# Patient Record
Sex: Male | Born: 2000 | Race: White | Hispanic: No | Marital: Single | Smoking: Never smoker
Health system: Southern US, Community
[De-identification: ages and names within clinical notes are randomized; demographics above are authoritative.]

---

## 2019-08-19 ENCOUNTER — Emergency Department (HOSPITAL_COMMUNITY)
Admission: EM | Admit: 2019-08-19 | Discharge: 2019-08-19 | Disposition: A | Discharge status: Home / Self Care | Payer: BC Managed Care – PPO | Attending: Emergency Medicine | Admitting: Emergency Medicine

## 2019-08-19 ENCOUNTER — Other Ambulatory Visit: Payer: Self-pay

## 2019-08-19 ENCOUNTER — Encounter (HOSPITAL_COMMUNITY): Payer: Self-pay | Admitting: Emergency Medicine

## 2019-08-19 ENCOUNTER — Emergency Department (HOSPITAL_COMMUNITY): Payer: BC Managed Care – PPO

## 2019-08-19 DIAGNOSIS — Y92039 Unspecified place in apartment as the place of occurrence of the external cause: Secondary | ICD-10-CM | POA: Insufficient documentation

## 2019-08-19 DIAGNOSIS — Y939 Activity, unspecified: Secondary | ICD-10-CM | POA: Diagnosis not present

## 2019-08-19 DIAGNOSIS — Y999 Unspecified external cause status: Secondary | ICD-10-CM | POA: Diagnosis not present

## 2019-08-19 DIAGNOSIS — S60511A Abrasion of right hand, initial encounter: Secondary | ICD-10-CM | POA: Insufficient documentation

## 2019-08-19 DIAGNOSIS — W540XXA Bitten by dog, initial encounter: Secondary | ICD-10-CM | POA: Insufficient documentation

## 2019-08-19 DIAGNOSIS — S61451A Open bite of right hand, initial encounter: Secondary | ICD-10-CM

## 2019-08-19 MED ORDER — AMOXICILLIN-POT CLAVULANATE 875-125 MG PO TABS
1.0000 | ORAL_TABLET | Freq: Once | ORAL | Status: AC
  Administered 2019-08-19: 1 via ORAL
  Filled 2019-08-19: qty 1

## 2019-08-19 MED ORDER — BACITRACIN ZINC 500 UNIT/GM EX OINT
TOPICAL_OINTMENT | Freq: Two times a day (BID) | CUTANEOUS | Status: DC
  Administered 2019-08-19: 1 via TOPICAL
  Filled 2019-08-19: qty 0.9

## 2019-08-19 MED ORDER — AMOXICILLIN-POT CLAVULANATE 875-125 MG PO TABS: 1.0000 | ORAL_TABLET | Freq: Two times a day (BID) | ORAL | 0 refills | Status: AC

## 2019-08-19 NOTE — ED Provider Notes (Signed)
[facility] EMERGENCY DEPARTMENT Provider Note   CSN: [id number] Arrival date & time: 08/19/19  0112     History   Chief Complaint Chief Complaint  Patient presents with  . Dog Bite    Right Hand    HPI Vincent Benton is a 18 y.o. male.     Pt is fostering a pit mix.  He states he was getting ready to go to bed, so he was trying to wave the dog into it's crate.  When he waved his hand, the dog bit him.  Swelling & abrasions to R hand.  Pt states he has had a tetanus booster in the past 5 years, states he had to get one when he recently started college.   The history is provided by the patient.  Animal Bite Contact animal:  Dog Location:  Hand Hand injury location:  R hand Time since incident:  3 hours Pain details:    Quality:  Aching and sore   Severity:  Severe   Timing:  Constant   Progression:  Worsening Incident location:  Home Animal's rabies vaccination status:  Up to date Animal in possession: yes   Tetanus status:  Up to date Relieved by:  None tried Associated symptoms: swelling     History reviewed. No pertinent past medical history.  There are no active problems to display for this patient.   History reviewed. No pertinent surgical history.      Home Medications    Prior to Admission medications   Medication Sig Start Date End Date Taking? Authorizing Provider  amoxicillin-clavulanate (AUGMENTIN) 875-125 MG tablet Take 1 tablet by mouth every 12 (twelve) hours for 4 days. 08/19/19 08/23/19  Viviano Simas, NP    Family History No family history on file.  Social History Social History   Tobacco Use  . Smoking status: Never Smoker  . Smokeless tobacco: Never Used  Substance Use Topics  . Alcohol use: Never    Frequency: Never  . Drug use: Never     Allergies   Patient has no known allergies.   Review of Systems Review of Systems  All other systems reviewed and are negative.    Physical Exam  Updated Vital Signs There were no vitals taken for this visit.  Physical Exam Vitals signs and nursing note reviewed.  Constitutional:      General: He is not in acute distress.    Appearance: Normal appearance.  HENT:     Head: Normocephalic and atraumatic.     Nose: Nose normal.     Mouth/Throat:     Mouth: Mucous membranes are moist.     Pharynx: Oropharynx is clear.  Eyes:     Extraocular Movements: Extraocular movements intact.     Conjunctiva/sclera: Conjunctivae normal.  Neck:     Musculoskeletal: Normal range of motion.  Cardiovascular:     Rate and Rhythm: Normal rate.     Pulses: Normal pulses.  Pulmonary:     Effort: Pulmonary effort is normal.  Musculoskeletal: Normal range of motion.     Right hand: He exhibits tenderness and swelling. He exhibits normal range of motion and no deformity.     Comments: R hand w/ small abrasions (~1 cm)  to dorsomedial hand just proximal to 5th MCP joint, another 1 cm abrasion to R thenar eminence. 2-3 mm hematoma adjacent.  Full ROM of R hand & fingers, sensation intact.   Skin:    General: Skin is warm and dry.  Findings: No rash.  Neurological:     General: No focal deficit present.     Mental Status: He is alert.     Coordination: Coordination normal.      ED Treatments / Results  Labs (all labs ordered are listed, but only abnormal results are displayed) Labs Reviewed - No data to display  EKG None  Radiology Dg Hand Complete Right  Result Date: 08/19/2019 CLINICAL DATA:  Dog bite to right hand EXAM: RIGHT HAND - COMPLETE 3+ VIEW COMPARISON:  None. FINDINGS: There is no evidence of fracture or dislocation. There is no evidence of arthropathy or other focal bone abnormality. Soft tissues are unremarkable. No radiopaque foreign body. IMPRESSION: No acute bony abnormality or radiopaque foreign body. Electronically Signed   By: Rolm Baptise M.D.   On: 08/19/2019 01:49    Procedures Wound repair  Date/Time:  08/19/2019 3:54 AM Performed by: Charmayne Sheer, NP Authorized by: Charmayne Sheer, NP  Consent: Verbal consent obtained. Consent given by: patient Patient identity confirmed: arm band Time out: Immediately prior to procedure a "time out" was called to verify the correct patient, procedure, equipment, support staff and site/side marked as required. Local anesthesia used: no  Anesthesia: Local anesthesia used: no  Sedation: Patient sedated: no  Patient tolerance: patient tolerated the procedure well with no immediate complications Comments: Scrubbed abrasions on R hand w/ betadine, syringe irrigated w/ NS.  Applied bacitracin ointment.     (including critical care time)  Medications Ordered in ED Medications  amoxicillin-clavulanate (AUGMENTIN) 875-125 MG per tablet 1 tablet (has no administration in time range)  bacitracin ointment (has no administration in time range)     Initial Impression / Assessment and Plan / ED Course  I have reviewed the triage vital signs and the nursing notes.  Pertinent labs & imaging results that were available during my care of the patient were reviewed by me and considered in my medical decision making (see chart for details).        24 yom presents to the ED with dog bite to R hand.  Has abrasions & swelling as noted above.  Sensation intact, full ROM.  Cleaned wounds & applied antibiotic ointment as noted.  Dog & Pt vaccines UTD, dog is still in pt's possession.  Will start on augmentin for infection prophylaxis, first dose given prior to d/c. Discussed supportive care as well need for f/u w/ PCP in 1-2 days.  Also discussed sx that warrant sooner re-eval in ED. Patient / Family / Caregiver informed of clinical course, understand medical decision-making process, and agree with plan.   Final Clinical Impressions(s) / ED Diagnoses   Final diagnoses:  Dog bite of right hand, initial encounter    ED Discharge Orders         Ordered     amoxicillin-clavulanate (AUGMENTIN) 875-125 MG tablet  Every 12 hours     08/19/19 0357           Charmayne Sheer, NP 08/19/19 0357    Mesner, Corene Cornea, MD 08/19/19 [phone]

## 2019-08-19 NOTE — ED Triage Notes (Signed)
Patient reports dog bite to right hand this evening , presents with multiple superficial puncture wounds at right hand , no bleeding , mild swelling .

## 2019-08-19 NOTE — Discharge Instructions (Signed)
Monitor & return for signs of infection: Worsening pain, swelling, redness, pus drainage, or red streaking from bite sites. Apply antibiotic ointment 2-3 times/day.

## 2020-10-14 IMAGING — CR DG HAND COMPLETE 3+V*R*
3 series · 3 of 3 positions shown · non-contrast
Comparison: None.

CLINICAL DATA: Dog bite to right hand

EXAM:
RIGHT HAND - COMPLETE 3+ VIEW

[hand pa]
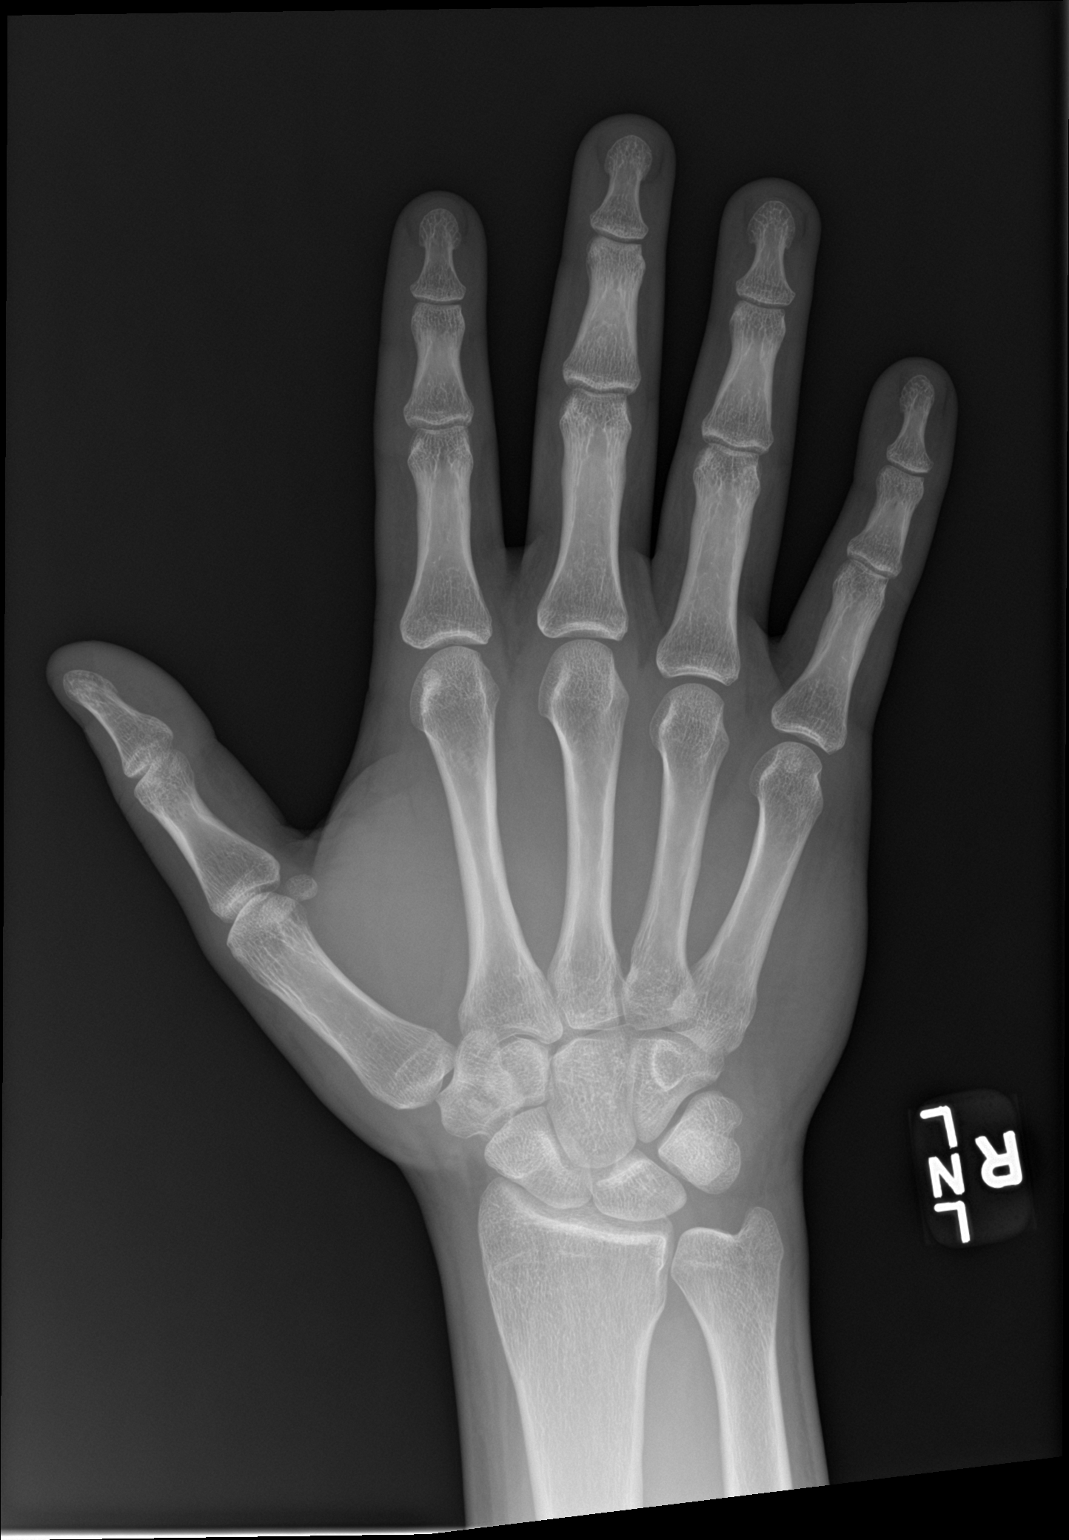

[hand obl]
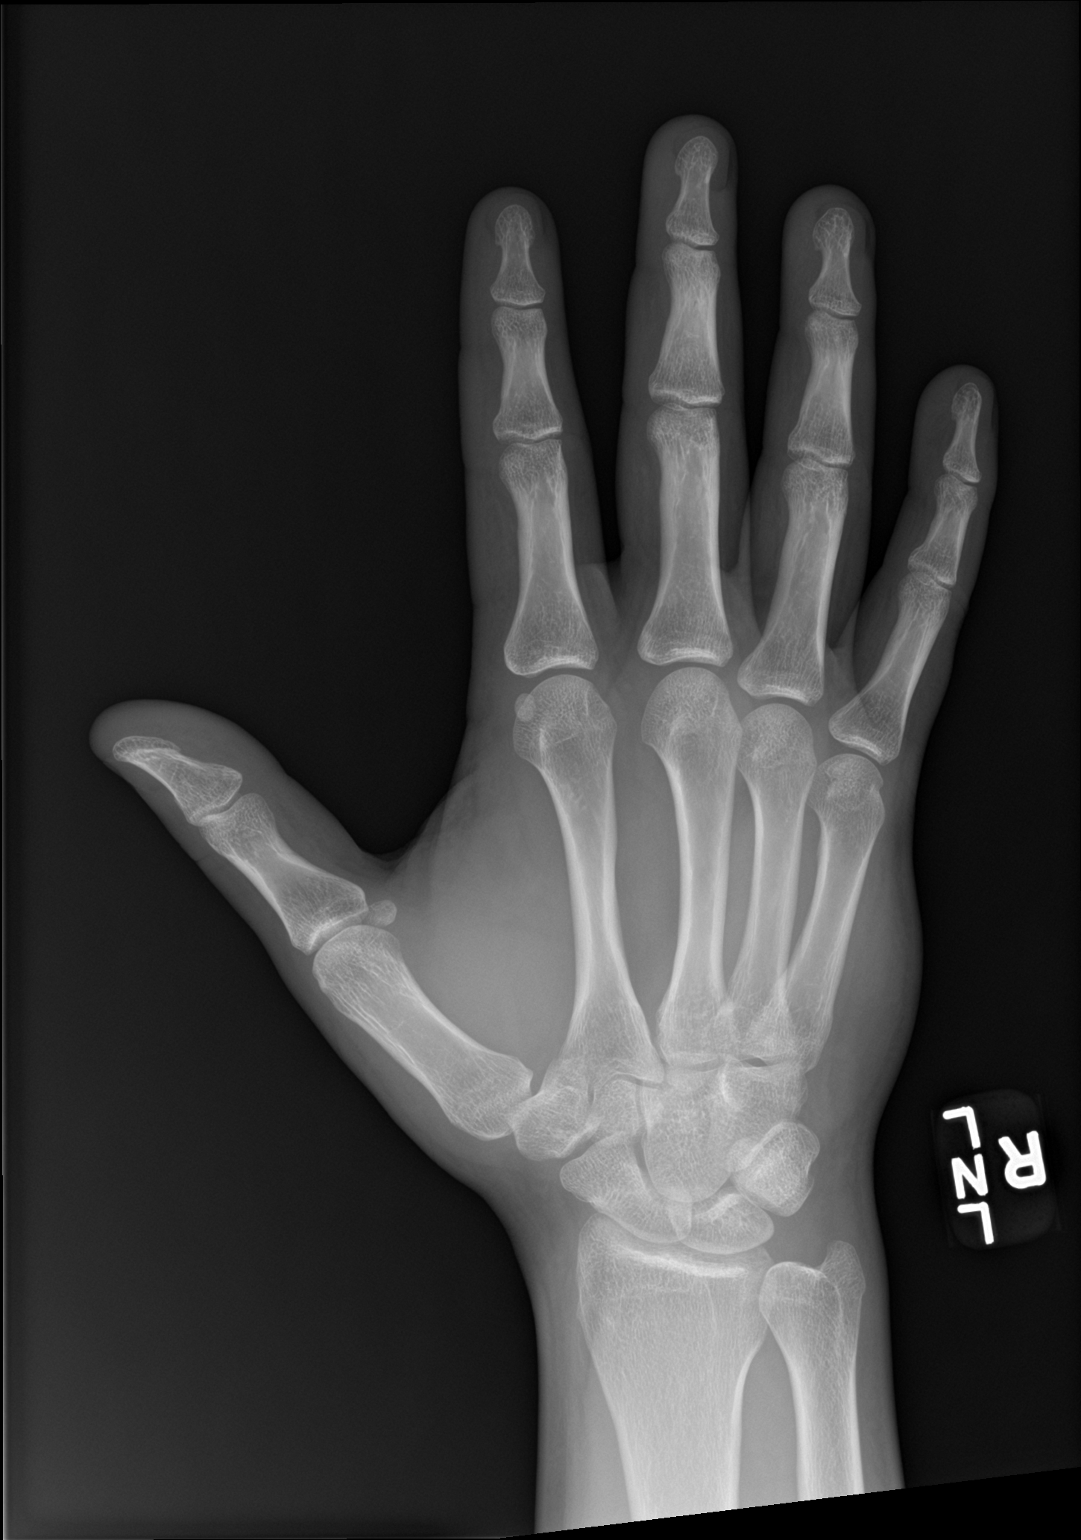

[hand lat]
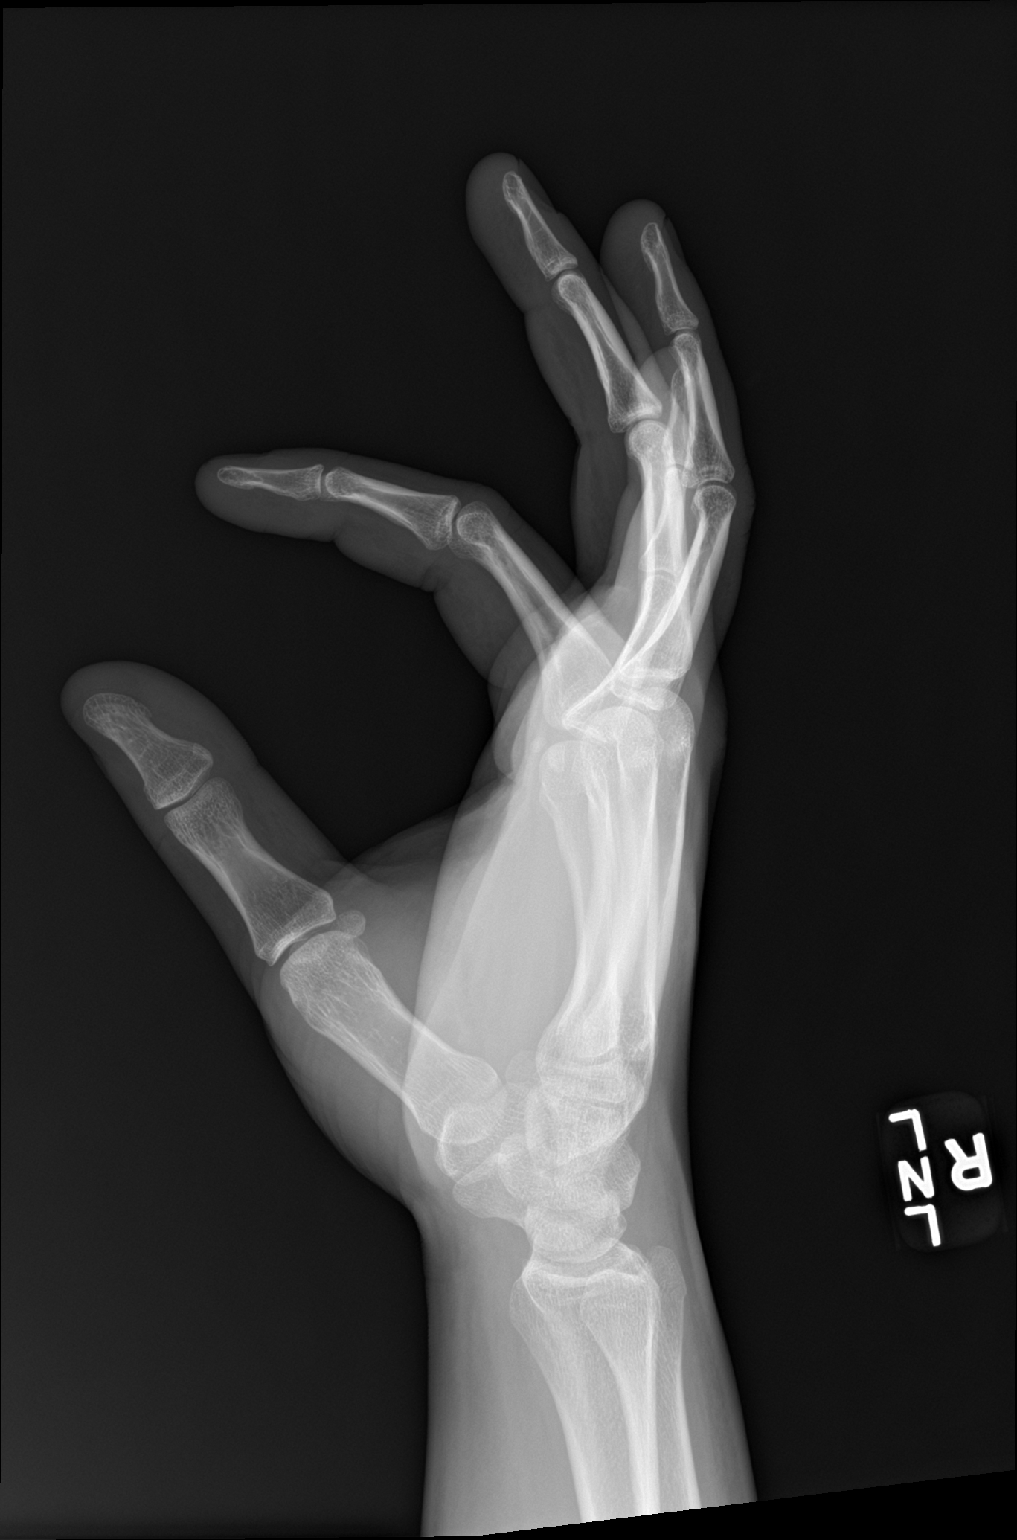

[3 of 3 positions shown; findings below may reference images not displayed]

FINDINGS: There is no evidence of fracture or dislocation. There is no
evidence of arthropathy or other focal bone abnormality. Soft
tissues are unremarkable. No radiopaque foreign body.
IMPRESSION: No acute bony abnormality or radiopaque foreign body.
